# Patient Record
Sex: Female | Born: 2000 | Race: Black or African American | Hispanic: No | Marital: Single | Smoking: Never smoker
Health system: Southern US, Community
[De-identification: ages and names within clinical notes are randomized; demographics above are authoritative.]

## PROBLEM LIST (undated history)

## (undated) ENCOUNTER — Ambulatory Visit (HOSPITAL_COMMUNITY): Admission: EM | Payer: Medicaid Other | Source: Home / Self Care

## (undated) DIAGNOSIS — J45909 Unspecified asthma, uncomplicated: Secondary | ICD-10-CM

---
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
## 2022-03-22 ENCOUNTER — Ambulatory Visit (HOSPITAL_COMMUNITY)
Admission: EM | Admit: 2022-03-22 | Discharge: 2022-03-22 | Disposition: A | Discharge status: Home / Self Care | Payer: Medicaid Other | Attending: Nurse Practitioner | Admitting: Nurse Practitioner

## 2022-03-22 DIAGNOSIS — F4323 Adjustment disorder with mixed anxiety and depressed mood: Secondary | ICD-10-CM | POA: Insufficient documentation

## 2022-03-22 DIAGNOSIS — F1290 Cannabis use, unspecified, uncomplicated: Secondary | ICD-10-CM | POA: Insufficient documentation

## 2022-03-22 DIAGNOSIS — Z5900 Homelessness unspecified: Secondary | ICD-10-CM

## 2022-03-22 DIAGNOSIS — Z5902 Unsheltered homelessness: Secondary | ICD-10-CM | POA: Insufficient documentation
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
## 2022-03-22 NOTE — ED Notes (Signed)
[facility] called for transportation home

## 2022-03-22 NOTE — Discharge Instructions (Addendum)
  Discharge recommendations:  Patient is to take medications as prescribed. Please see information for follow-up appointment with psychiatry and therapy. Please follow up with your primary care provider for all medical related needs.   Therapy: We recommend that patient participate in individual therapy to address mental health concerns.  Medications: The parent/guardian is to contact a medical professional and/or outpatient provider to address any new side effects that develop. Parent/guardian should update outpatient providers of any new medications and/or medication changes.    Safety:  The patient should abstain from use of illicit substances/drugs and abuse of any medications. If symptoms worsen or do not continue to improve or if the patient becomes actively suicidal or homicidal then it is recommended that the patient return to the closest hospital emergency department, the [facility], or call 911 for further evaluation and treatment. National Suicide Prevention Lifeline 1-800-SUICIDE or 234-197-3670.  About 988 988 offers 24/7 access to trained crisis counselors who can help people experiencing mental health-related distress. People can call or text 988 or chat 988lifeline.org for themselves or if they are worried about a loved one who may need crisis support.  Crisis Mobile: [facility]:                     [phone] [phone] (for crisis response 24 hours a day) [facility]:                                            [phone] [facility] [address] [place], [place]  [phone] Monday-Friday 8am-3pm Saturday-Sunday: 8am-2Pm Safe place to rest, take care of basic needs, do laundry and etc.   [employer] (leslies house) [address] [place] [phone]  Shelter for women without dependents and offers 22 shelter beds  [facility] [address] [place], [place] [address] [phone]  Food Pantry Monday-Friday 9"30am-3:30pm Community Lunch : 10:30am-12:30am Call for shelter openings and or present at [employer]:  [address] [place], [place] [address] [phone]  Provides shelter.  Please call and or present at 8am for openings.   Free hot meals:  [facility] [address]  [place], [place]
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
## 2023-08-13 ENCOUNTER — Emergency Department (HOSPITAL_COMMUNITY)
Admission: EM | Admit: 2023-08-13 | Discharge: 2023-08-14 | Discharge status: Against Medical Advice | Payer: MEDICAID | Attending: Emergency Medicine | Admitting: Emergency Medicine

## 2023-08-13 ENCOUNTER — Other Ambulatory Visit: Payer: Self-pay

## 2023-08-13 DIAGNOSIS — J029 Acute pharyngitis, unspecified: Secondary | ICD-10-CM | POA: Diagnosis present

## 2023-08-13 DIAGNOSIS — R059 Cough, unspecified: Secondary | ICD-10-CM | POA: Diagnosis not present

## 2023-08-13 DIAGNOSIS — Z5321 Procedure and treatment not carried out due to patient leaving prior to being seen by health care provider: Secondary | ICD-10-CM | POA: Insufficient documentation

## 2023-08-13 DIAGNOSIS — R112 Nausea with vomiting, unspecified: Secondary | ICD-10-CM | POA: Diagnosis not present

## 2023-08-13 LAB — CBC WITH DIFFERENTIAL/PLATELET
Abs Immature Granulocytes: 0.06 10*3/uL (ref 0.00–0.07)
Basophils Absolute: 0.1 10*3/uL (ref 0.0–0.1)
Basophils Relative: 1 %
Eosinophils Absolute: 0.2 10*3/uL (ref 0.0–0.5)
Eosinophils Relative: 1 %
HCT: 41.8 % (ref 36.0–46.0)
Hemoglobin: 14.4 g/dL (ref 12.0–15.0)
Immature Granulocytes: 0 %
Lymphocytes Relative: 9 %
Lymphs Abs: 1.4 10*3/uL (ref 0.7–4.0)
MCH: 27.8 pg (ref 26.0–34.0)
MCHC: 34.4 g/dL (ref 30.0–36.0)
MCV: 80.7 fL (ref 80.0–100.0)
Monocytes Absolute: 1.2 10*3/uL — ABNORMAL HIGH (ref 0.1–1.0)
Monocytes Relative: 8 %
Neutro Abs: 12 10*3/uL — ABNORMAL HIGH (ref 1.7–7.7)
Neutrophils Relative %: 81 %
Platelets: 249 10*3/uL (ref 150–400)
RBC: 5.18 MIL/uL — ABNORMAL HIGH (ref 3.87–5.11)
RDW: 13.1 % (ref 11.5–15.5)
WBC: 14.8 10*3/uL — ABNORMAL HIGH (ref 4.0–10.5)
nRBC: 0 % (ref 0.0–0.2)

## 2023-08-13 LAB — COMPREHENSIVE METABOLIC PANEL WITH GFR
ALT: 10 U/L (ref 0–44)
AST: 16 U/L (ref 15–41)
Albumin: 4.2 g/dL (ref 3.5–5.0)
Alkaline Phosphatase: 77 U/L (ref 38–126)
Anion gap: 11 (ref 5–15)
BUN: <5 mg/dL — ABNORMAL LOW (ref 6–20)
CO2: 22 mmol/L (ref 22–32)
Calcium: 9.2 mg/dL (ref 8.9–10.3)
Chloride: 108 mmol/L (ref 98–111)
Creatinine, Ser: 0.79 mg/dL (ref 0.44–1.00)
GFR, Estimated: >60 mL/min (ref >60)
Glucose, Bld: 98 mg/dL (ref 70–99)
Potassium: 3.3 mmol/L — ABNORMAL LOW (ref 3.5–5.1)
Sodium: 141 mmol/L (ref 135–145)
Total Bilirubin: 0.9 mg/dL (ref 0.0–1.2)
Total Protein: 7.4 g/dL (ref 6.5–8.1)

## 2023-08-13 LAB — RESP PANEL BY RT-PCR (RSV, FLU A&B, COVID)  RVPGX2
Influenza A by PCR: NEGATIVE
Influenza B by PCR: NEGATIVE
Resp Syncytial Virus by PCR: NEGATIVE
SARS Coronavirus 2 by RT PCR: NEGATIVE

## 2023-08-13 LAB — MONONUCLEOSIS SCREEN: Mono Screen: NEGATIVE

## 2023-08-13 LAB — HCG, SERUM, QUALITATIVE: Preg, Serum: NEGATIVE

## 2023-08-13 MED ORDER — ONDANSETRON 4 MG PO TBDP
4.0000 mg | ORAL_TABLET | Freq: Once | ORAL | Status: AC
  Administered 2023-08-13: 4 mg via ORAL
  Filled 2023-08-13: qty 1

## 2023-08-13 MED ORDER — ONDANSETRON HCL 4 MG/2ML IJ SOLN: 4.0000 mg | Freq: Once | INTRAMUSCULAR | Status: DC

## 2023-08-13 NOTE — ED Triage Notes (Signed)
 Pt report sore throat that started today along with NV> no abdominal pain. No chill or fevers.

## 2023-08-13 NOTE — ED Provider Triage Note (Signed)
 Emergency Medicine Provider Triage Evaluation Note  Lindsay Holloway , a 23 y.o. female  was evaluated in triage.  Pt complains of sore throat for the past 2 days.  Also reports some nausea and vomiting.  Denies any abdominal pain, fever or chills.  Denies any difficulty swallowing.  Does report a mild cough.  Review of Systems  Positive: As above Negative: As above  Physical Exam  BP 110/75 (BP Location: Right Arm)   Pulse 80   Temp 98.4 F (36.9 C) (Oral)   Resp 14   LMP 08/11/2023   SpO2 99%  Gen:   Awake, no distress   Resp:  Normal effort  MSK:   Moves extremities without difficulty    Medical Decision Making  Medically screening exam initiated at 9:55 PM.  Appropriate orders placed.  Vlada Uriostegui was informed that the remainder of the evaluation will be completed by another provider, this initial triage assessment does not replace that evaluation, and the importance of remaining in the ED until their evaluation is complete.     Rexie Catena, PA-C 08/13/23 [id number]

## 2023-08-14 LAB — GROUP A STREP BY PCR: Group A Strep by PCR: NOT DETECTED

## 2023-08-14 NOTE — ED Notes (Signed)
 Called multiple times for vitals, no response,

## 2023-12-27 IMAGING — DX DG CHEST 2V
2 series · 2 of 2 positions shown · non-contrast
Comparison: Chest x-ray dated September 08, 2019.

CLINICAL DATA: Cough and hemoptysis.  Sore throat for 2 days.

EXAM:
CHEST - 2 VIEW

[chest pa]
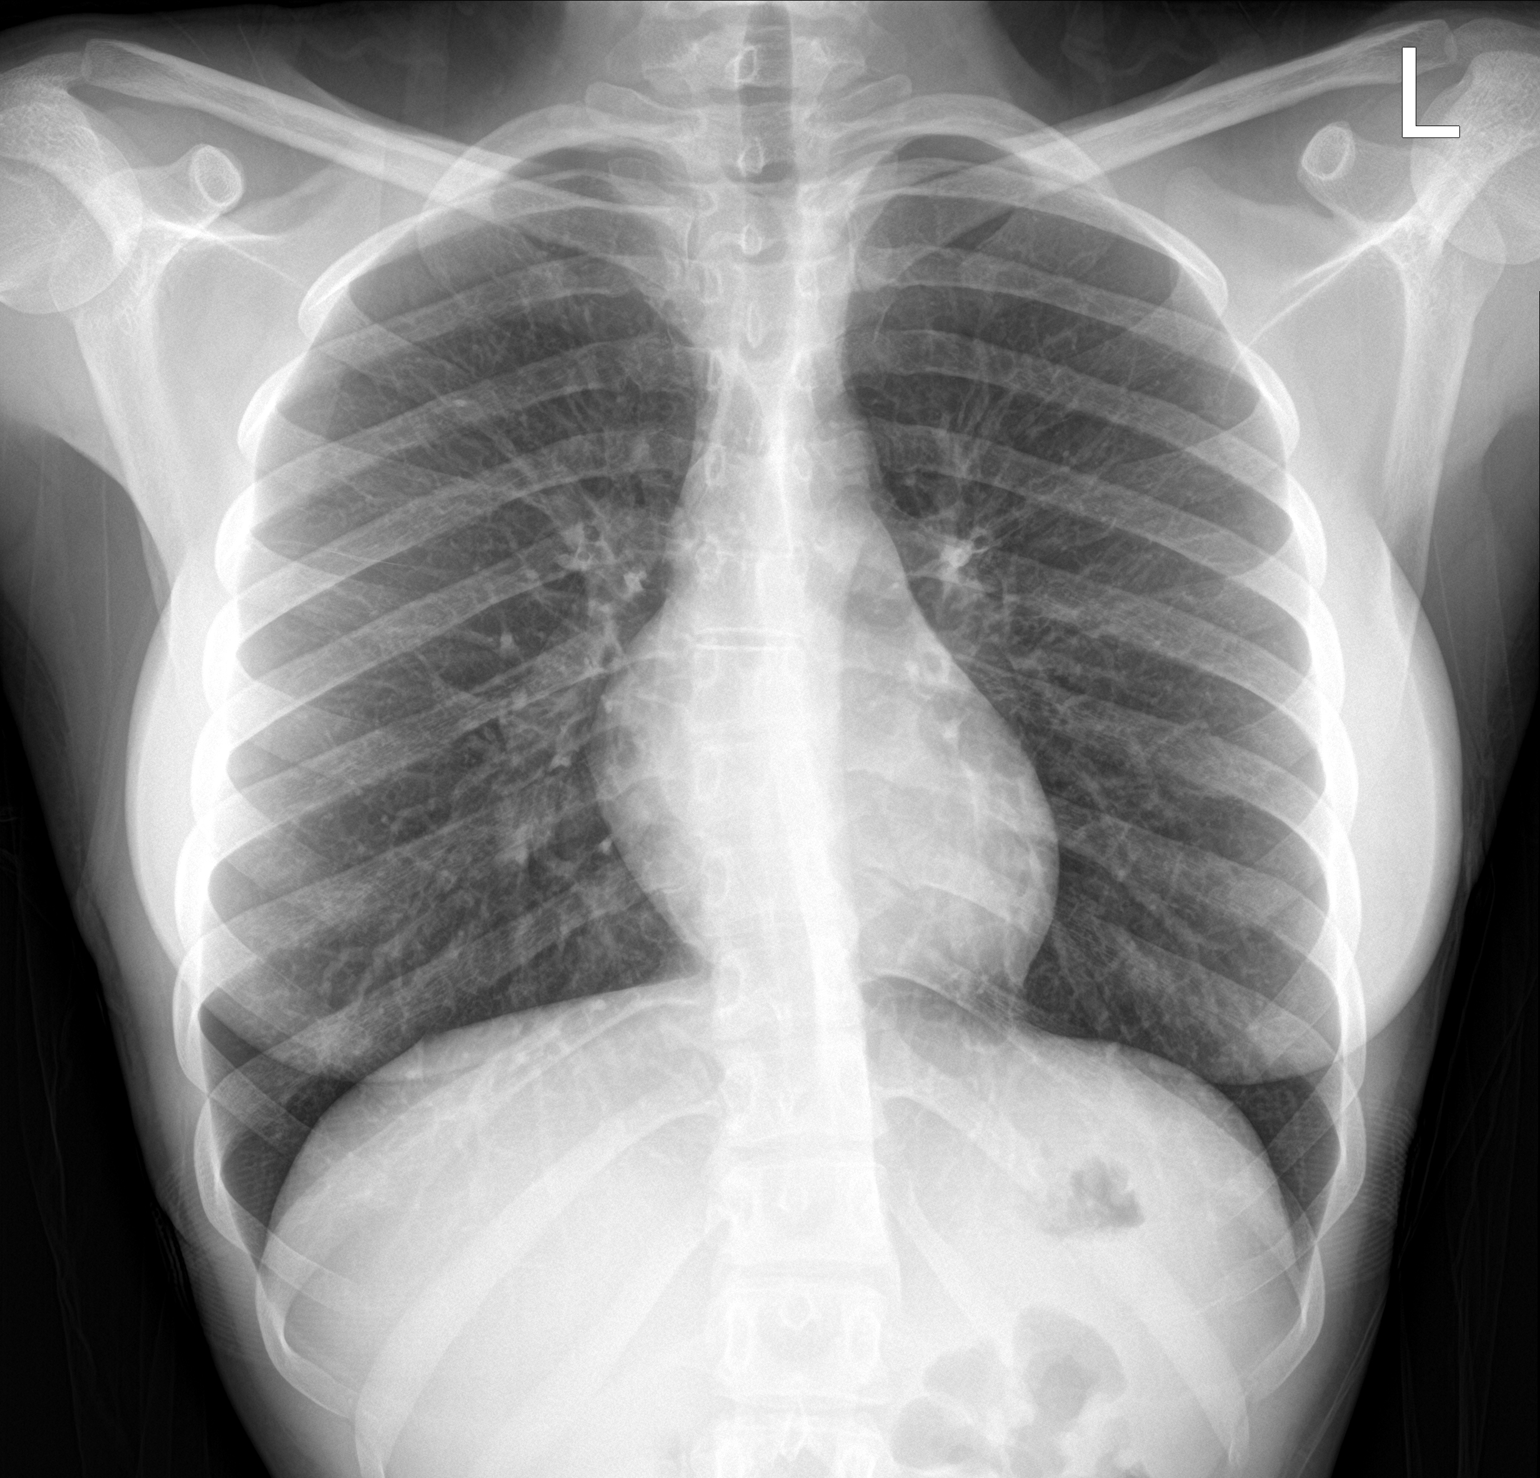

[chest lat]
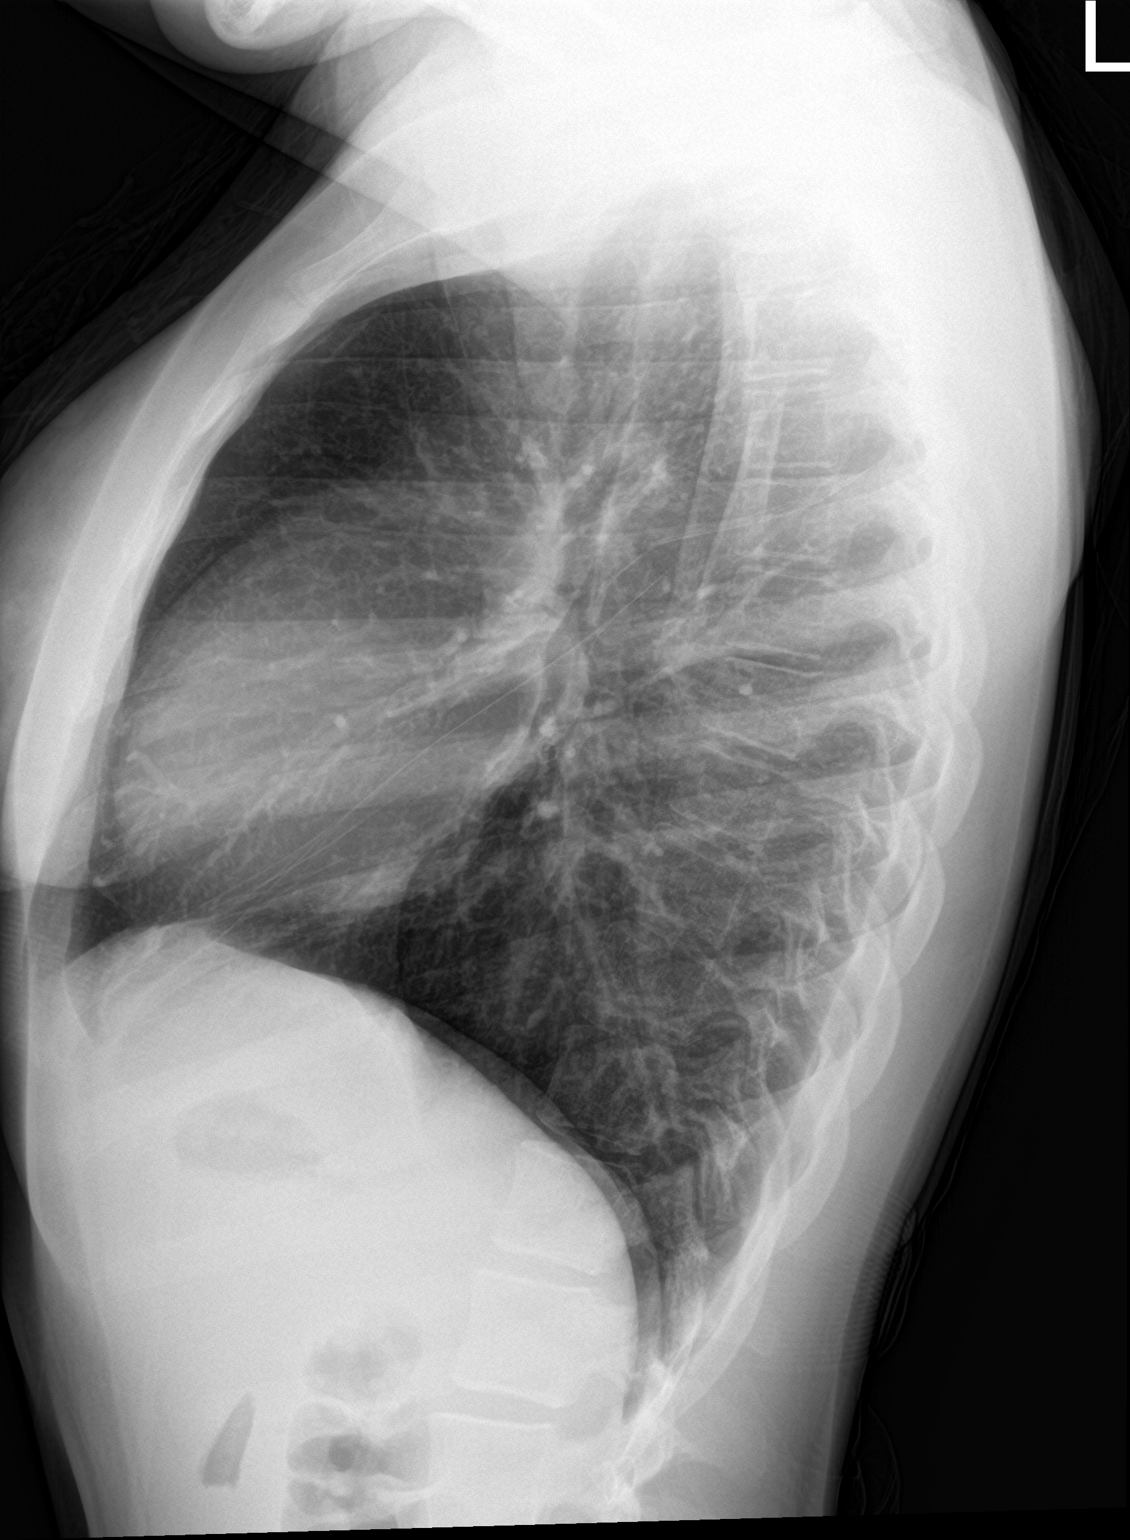

[2 of 2 positions shown; findings below may reference images not displayed]

FINDINGS: The heart size and mediastinal contours are within normal limits.
Normal pulmonary vascularity. New small opacity at the right lung
base. No focal consolidation, pleural effusion, or pneumothorax. No
acute osseous abnormality.
IMPRESSION: 1. New small opacity at the right lung base may reflect developing
pneumonia.
